# Patient Record
Sex: Male | Born: 1970 | Race: White | Hispanic: No | Marital: Married | Smoking: Current some day smoker
Health system: Southern US, Community
[De-identification: ages and names within clinical notes are randomized; demographics above are authoritative.]

## PROBLEM LIST (undated history)

## (undated) DIAGNOSIS — G8929 Other chronic pain: Secondary | ICD-10-CM

## (undated) DIAGNOSIS — J45909 Unspecified asthma, uncomplicated: Secondary | ICD-10-CM

## (undated) HISTORY — PX: BACK SURGERY: SHX140

---

## 2019-11-11 ENCOUNTER — Emergency Department (HOSPITAL_BASED_OUTPATIENT_CLINIC_OR_DEPARTMENT_OTHER): Payer: 59

## 2019-11-11 ENCOUNTER — Encounter (HOSPITAL_BASED_OUTPATIENT_CLINIC_OR_DEPARTMENT_OTHER): Payer: Self-pay | Admitting: *Deleted

## 2019-11-11 ENCOUNTER — Other Ambulatory Visit: Payer: Self-pay

## 2019-11-11 ENCOUNTER — Inpatient Hospital Stay (HOSPITAL_BASED_OUTPATIENT_CLINIC_OR_DEPARTMENT_OTHER)
Admission: EM | Admit: 2019-11-11 | Discharge: 2019-11-13 | DRG: 581 | Disposition: A | Discharge status: Home / Self Care | Payer: 59 | Attending: Family Medicine | Admitting: Family Medicine

## 2019-11-11 DIAGNOSIS — L03317 Cellulitis of buttock: Secondary | ICD-10-CM

## 2019-11-11 DIAGNOSIS — D461 Refractory anemia with ring sideroblasts: Secondary | ICD-10-CM | POA: Diagnosis present

## 2019-11-11 DIAGNOSIS — A419 Sepsis, unspecified organism: Secondary | ICD-10-CM

## 2019-11-11 DIAGNOSIS — G894 Chronic pain syndrome: Secondary | ICD-10-CM | POA: Diagnosis present

## 2019-11-11 DIAGNOSIS — E876 Hypokalemia: Secondary | ICD-10-CM | POA: Diagnosis present

## 2019-11-11 DIAGNOSIS — L0291 Cutaneous abscess, unspecified: Secondary | ICD-10-CM

## 2019-11-11 DIAGNOSIS — Z20822 Contact with and (suspected) exposure to covid-19: Secondary | ICD-10-CM | POA: Diagnosis present

## 2019-11-11 DIAGNOSIS — Z87891 Personal history of nicotine dependence: Secondary | ICD-10-CM

## 2019-11-11 DIAGNOSIS — L0231 Cutaneous abscess of buttock: Secondary | ICD-10-CM | POA: Diagnosis not present

## 2019-11-11 DIAGNOSIS — Z79899 Other long term (current) drug therapy: Secondary | ICD-10-CM

## 2019-11-11 DIAGNOSIS — W57XXXA Bitten or stung by nonvenomous insect and other nonvenomous arthropods, initial encounter: Secondary | ICD-10-CM | POA: Diagnosis present

## 2019-11-11 DIAGNOSIS — J45909 Unspecified asthma, uncomplicated: Secondary | ICD-10-CM | POA: Diagnosis present

## 2019-11-11 DIAGNOSIS — F419 Anxiety disorder, unspecified: Secondary | ICD-10-CM | POA: Diagnosis present

## 2019-11-11 HISTORY — DX: Other chronic pain: G89.29

## 2019-11-11 HISTORY — DX: Unspecified asthma, uncomplicated: J45.909

## 2019-11-11 LAB — CBC WITH DIFFERENTIAL/PLATELET
Abs Immature Granulocytes: 0.11 10*3/uL — ABNORMAL HIGH (ref 0.00–0.07)
Basophils Absolute: 0 10*3/uL (ref 0.0–0.1)
Basophils Relative: 0 %
Eosinophils Absolute: 0 10*3/uL (ref 0.0–0.5)
Eosinophils Relative: 0 %
HCT: 36.5 % — ABNORMAL LOW (ref 39.0–52.0)
Hemoglobin: 11.7 g/dL — ABNORMAL LOW (ref 13.0–17.0)
Immature Granulocytes: 1 %
Lymphocytes Relative: 7 %
Lymphs Abs: 1.1 10*3/uL (ref 0.7–4.0)
MCH: 26.2 pg (ref 26.0–34.0)
MCHC: 32.1 g/dL (ref 30.0–36.0)
MCV: 81.7 fL (ref 80.0–100.0)
Monocytes Absolute: 0.9 10*3/uL (ref 0.1–1.0)
Monocytes Relative: 6 %
Neutro Abs: 14.1 10*3/uL — ABNORMAL HIGH (ref 1.7–7.7)
Neutrophils Relative %: 86 %
Platelets: 346 10*3/uL (ref 150–400)
RBC: 4.47 MIL/uL (ref 4.22–5.81)
RDW: 15.4 % (ref 11.5–15.5)
WBC: 16.4 10*3/uL — ABNORMAL HIGH (ref 4.0–10.5)
nRBC: 0 % (ref 0.0–0.2)

## 2019-11-11 LAB — PROTIME-INR
INR: 1.1 (ref 0.8–1.2)
Prothrombin Time: 13.8 seconds (ref 11.4–15.2)

## 2019-11-11 LAB — COMPREHENSIVE METABOLIC PANEL
ALT: 13 U/L (ref 0–44)
AST: 15 U/L (ref 15–41)
Albumin: 4.1 g/dL (ref 3.5–5.0)
Alkaline Phosphatase: 77 U/L (ref 38–126)
Anion gap: 15 (ref 5–15)
BUN: 13 mg/dL (ref 6–20)
CO2: 25 mmol/L (ref 22–32)
Calcium: 9.2 mg/dL (ref 8.9–10.3)
Chloride: 99 mmol/L (ref 98–111)
Creatinine, Ser: 1.11 mg/dL (ref 0.61–1.24)
GFR calc Af Amer: >60 mL/min (ref >60)
GFR calc non Af Amer: >60 mL/min (ref >60)
Glucose, Bld: 117 mg/dL — ABNORMAL HIGH (ref 70–99)
Potassium: 3.7 mmol/L (ref 3.5–5.1)
Sodium: 139 mmol/L (ref 135–145)
Total Bilirubin: 0.6 mg/dL (ref 0.3–1.2)
Total Protein: 8.6 g/dL — ABNORMAL HIGH (ref 6.5–8.1)

## 2019-11-11 LAB — URINALYSIS, ROUTINE W REFLEX MICROSCOPIC
Glucose, UA: NEGATIVE mg/dL
Hgb urine dipstick: NEGATIVE
Ketones, ur: 40 mg/dL — AB
Leukocytes,Ua: NEGATIVE
Nitrite: NEGATIVE
Protein, ur: 100 mg/dL — AB
Specific Gravity, Urine: 1.025 (ref 1.005–1.030)
pH: 6 (ref 5.0–8.0)

## 2019-11-11 LAB — URINALYSIS, MICROSCOPIC (REFLEX): RBC / HPF: NONE SEEN RBC/hpf (ref 0–5)

## 2019-11-11 LAB — APTT: aPTT: 50 seconds — ABNORMAL HIGH (ref 24–36)

## 2019-11-11 LAB — SARS CORONAVIRUS 2 BY RT PCR (HOSPITAL ORDER, PERFORMED IN ~~LOC~~ HOSPITAL LAB): SARS Coronavirus 2: NEGATIVE

## 2019-11-11 LAB — LACTIC ACID, PLASMA
Lactic Acid, Venous: 1.3 mmol/L (ref 0.5–1.9)
Lactic Acid, Venous: 0.9 mmol/L (ref 0.5–1.9)

## 2019-11-11 MED ORDER — VANCOMYCIN HCL IN DEXTROSE 1-5 GM/200ML-% IV SOLN
1000.0000 mg | Freq: Once | INTRAVENOUS | Status: DC
  Filled 2019-11-11: qty 200

## 2019-11-11 MED ORDER — VANCOMYCIN HCL IN DEXTROSE 1-5 GM/200ML-% IV SOLN
1000.0000 mg | Freq: Three times a day (TID) | INTRAVENOUS | Status: DC
  Administered 2019-11-11 – 2019-11-13 (×6): 1000 mg via INTRAVENOUS
  Filled 2019-11-11 (×6): qty 200

## 2019-11-11 MED ORDER — ONDANSETRON HCL 4 MG/2ML IJ SOLN
4.0000 mg | Freq: Once | INTRAMUSCULAR | Status: AC
  Administered 2019-11-11: 4 mg via INTRAVENOUS
  Filled 2019-11-11: qty 2

## 2019-11-11 MED ORDER — SODIUM CHLORIDE 0.9 % IV SOLN
2.0000 g | Freq: Once | INTRAVENOUS | Status: AC
  Administered 2019-11-11: 2 g via INTRAVENOUS
  Filled 2019-11-11: qty 20

## 2019-11-11 MED ORDER — MORPHINE SULFATE (PF) 4 MG/ML IV SOLN
4.0000 mg | Freq: Once | INTRAVENOUS | Status: AC
  Administered 2019-11-11: 4 mg via INTRAVENOUS
  Filled 2019-11-11: qty 1

## 2019-11-11 MED ORDER — LIDOCAINE-EPINEPHRINE (PF) 2 %-1:200000 IJ SOLN
10.0000 mL | Freq: Once | INTRAMUSCULAR | Status: AC
  Administered 2019-11-11: 10 mL
  Filled 2019-11-11: qty 10

## 2019-11-11 MED ORDER — VANCOMYCIN HCL 2000 MG/400ML IV SOLN
2000.0000 mg | Freq: Once | INTRAVENOUS | Status: DC
  Filled 2019-11-11: qty 400

## 2019-11-11 MED ORDER — LACTATED RINGERS IV BOLUS
1000.0000 mL | Freq: Once | INTRAVENOUS | Status: AC
  Administered 2019-11-11: 1000 mL via INTRAVENOUS

## 2019-11-11 NOTE — ED Triage Notes (Signed)
C/o insect bite to right buttocks x 1 week ago

## 2019-11-11 NOTE — ED Provider Notes (Signed)
[facility] EMERGENCY DEPARTMENT Provider Note   CSN: [id number] Arrival date & time: 11/11/19  1739     History Chief Complaint  Patient presents with  . Insect Bite    Sergio Conley is a 49 y.o. male with past medical history of asthma, chronic back pain, who presents today for evaluation of pain on his right buttock.  He reports that he was recently traveling and about 8 days ago noticed a area of swelling on the right sided buttock.  He denies any injections.  He thinks he may have been bit by a spider however did not see a spider or bug in the area.  He states that he had been putting Neosporin on it and it had been getting significantly better until he was driving for an extended period of time and when he stopped at a hotel realized that he had blood all over his shorts.  He states that over the past 36 hours he has had significant increase in swelling and pain.  He reports that he has had chills however does not think he has had any fevers.  He did have Tylenol earlier today with his chronic pain medications.  He is still traveling and lives in [place].  He denies any allergies to medications.  He has not had any antibiotics recently.  HPI     Past Medical History:  Diagnosis Date  . Asthma   . Chronic back pain     Patient Active Problem List   Diagnosis Date Noted  . Cellulitis and abscess of buttock 11/11/2019    Past Surgical History:  Procedure Laterality Date  . BACK SURGERY         No family history on file.  Social History   Tobacco Use  . Smoking status: Current Some Day Smoker  . Smokeless tobacco: Never Used  Vaping Use  . Vaping Use: Never used  Substance Use Topics  . Alcohol use: Not Currently  . Drug use: Not Currently    Home Medications Prior to Admission medications   Medication Sig Start Date End Date Taking? Authorizing Provider  oxyCODONE-acetaminophen (PERCOCET/ROXICET) 5-325 MG tablet Take by mouth every 4 (four)  hours as needed for severe pain.   Yes [provider]    Allergies    Patient has no known allergies.  Review of Systems   Review of Systems  Constitutional: Positive for chills and fatigue. Negative for fever.  HENT: Negative for congestion.   Respiratory: Negative for cough and shortness of breath.   Cardiovascular: Negative for chest pain.  Gastrointestinal: Negative for abdominal pain, nausea and vomiting.  Genitourinary: Negative for dysuria and urgency.  Musculoskeletal: Positive for back pain (Chronic, unchanged).  Skin: Positive for color change and wound.  Neurological: Negative for weakness and headaches.  All other systems reviewed and are negative.   Physical Exam Updated Vital Signs BP (!) 158/86   Pulse 86   Temp 98.9 F (37.2 C)   Resp 16   Ht 5\' 7"  (1.702 m)   Wt 98.9 kg   SpO2 98%   BMI 34.14 kg/m   Physical Exam Vitals and nursing note reviewed.  Constitutional:      General: He is not in acute distress.    Appearance: He is well-developed. He is not diaphoretic.  HENT:     Head: Normocephalic and atraumatic.  Eyes:     General: No scleral icterus.       Right eye: No discharge.  Left eye: No discharge.     Conjunctiva/sclera: Conjunctivae normal.  Cardiovascular:     Rate and Rhythm: Normal rate and regular rhythm.  Pulmonary:     Effort: Pulmonary effort is normal. No respiratory distress.     Breath sounds: No stridor.  Abdominal:     General: There is no distension.     Tenderness: There is no abdominal tenderness. There is no guarding.  Musculoskeletal:        General: No deformity.     Cervical back: Normal range of motion and neck supple.     Right lower leg: No edema.     Left lower leg: No edema.  Skin:    General: Skin is warm and dry.     Comments: There is an area approximately 25 cm x 15 cm on the right buttock with a centralized area of significant edema and fluctuance with drainage.  Please see clinical  images.  Neurological:     General: No focal deficit present.     Mental Status: He is alert and oriented to person, place, and time.     Motor: No abnormal muscle tone.  Psychiatric:        Behavior: Behavior normal.         ED Results / Procedures / Treatments   Labs (all labs ordered are listed, but only abnormal results are displayed) Labs Reviewed  COMPREHENSIVE METABOLIC PANEL - Abnormal; Notable for the following components:      Result Value   Glucose, Bld 117 (*)    Total Protein 8.6 (*)    All other components within normal limits  CBC WITH DIFFERENTIAL/PLATELET - Abnormal; Notable for the following components:   WBC 16.4 (*)    Hemoglobin 11.7 (*)    HCT 36.5 (*)    Neutro Abs 14.1 (*)    Abs Immature Granulocytes 0.11 (*)    All other components within normal limits  APTT - Abnormal; Notable for the following components:   aPTT 50 (*)    All other components within normal limits  URINALYSIS, ROUTINE W REFLEX MICROSCOPIC - Abnormal; Notable for the following components:   APPearance HAZY (*)    Bilirubin Urine SMALL (*)    Ketones, ur 40 (*)    Protein, ur 100 (*)    All other components within normal limits  URINALYSIS, MICROSCOPIC (REFLEX) - Abnormal; Notable for the following components:   Bacteria, UA RARE (*)    All other components within normal limits  SARS CORONAVIRUS 2 BY RT PCR (HOSPITAL ORDER, [facility])  CULTURE, BLOOD (ROUTINE X 2)  CULTURE, BLOOD (ROUTINE X 2)  URINE CULTURE  LACTIC ACID, PLASMA  LACTIC ACID, PLASMA  PROTIME-INR  HIV ANTIBODY (ROUTINE TESTING W REFLEX)    EKG EKG Interpretation  Date/Time:  Wednesday November 11 2019 18:57:04 EDT Ventricular Rate:  103 PR Interval:    QRS Duration: 96 QT Interval:  324 QTC Calculation: 425 R Axis:   62 Text Interpretation: Sinus tachycardia Probable left atrial enlargement Minimal ST depression, inferior leads No prior for comparison No STEMI Confirmed by  Nanda Quinton [phone]) on 11/11/2019 7:25:39 PM   Radiology DG Chest Port 1 View  Result Date: 11/11/2019 CLINICAL DATA:  Sepsis. Insect bite to the RIGHT buttock 1 week ago. EXAM: PORTABLE CHEST 1 VIEW COMPARISON:  None. FINDINGS: The heart size and mediastinal contours are within normal limits. Both lungs are clear. The visualized skeletal structures are unremarkable. IMPRESSION: No  active disease. Electronically Signed   By: Nolon Nations M.D.   On: 11/11/2019 19:50    Procedures .Marland KitchenIncision and Drainage  Date/Time: 11/11/2019 11:47 PM Performed by: Lorin Glass, PA-C Authorized by: Lorin Glass, PA-C   Consent:    Consent obtained:  Verbal   Consent given by:  Patient   Risks discussed:  Bleeding, incomplete drainage, pain and infection (Damage to other structures, need for additional procedures)   Alternatives discussed:  No treatment, alternative treatment and referral Location:    Type:  Abscess   Size:  6cmx6cm   Location:  Lower extremity   Lower extremity location:  Buttock   Buttock location:  R buttock Pre-procedure details:    Skin preparation:  Chloraprep Anesthesia (see MAR for exact dosages):    Anesthesia method:  Local infiltration   Local anesthetic:  Lidocaine 2% WITH epi Procedure type:    Complexity:  Complex Procedure details:    Incision types:  Stab incision   Incision depth:  Subcutaneous   Scalpel blade:  11   Wound management:  Probed and deloculated and irrigated with saline   Drainage:  Purulent and bloody (There is black necrotic material drainage also. )   Drainage amount:  Moderate   Wound treatment:  Wound left open   Packing material: 1 inch iodoform gauze.  Post-procedure details:    Patient tolerance of procedure:  Tolerated well, no immediate complications .Critical Care Performed by: Lorin Glass, PA-C Authorized by: Lorin Glass, PA-C   Critical care provider statement:    Critical care time  (minutes):  45   Critical care time was exclusive of:  Separately billable procedures and treating other patients and teaching time   Critical care was necessary to treat or prevent imminent or life-threatening deterioration of the following conditions:  Sepsis   Critical care was time spent personally by me on the following activities:  Discussions with consultants, evaluation of patient's response to treatment, examination of patient, ordering and performing treatments and interventions, ordering and review of laboratory studies, ordering and review of radiographic studies, pulse oximetry, re-evaluation of patient's condition, obtaining history from patient or surrogate, review of old charts and development of treatment plan with patient or surrogate Ultrasound ED Soft Tissue  Date/Time: 11/11/2019 11:55 PM Performed by: Lorin Glass, PA-C Authorized by: Lorin Glass, PA-C   Procedure details:    Indications: localization of abscess and evaluate for cellulitis     Transverse view:  Visualized   Longitudinal view:  Visualized   Images: archived   Location:    Location: buttocks     Side:  Right Findings:     abscess present    cellulitis present   (including critical care time)  Medications Ordered in ED Medications  vancomycin (VANCOREADY) IVPB 2000 mg/400 mL (has no administration in time range)  vancomycin (VANCOCIN) IVPB 1000 mg/200 mL premix (0 mg Intravenous Stopped 11/11/19 2136)  cefTRIAXone (ROCEPHIN) 2 g in sodium chloride 0.9 % 100 mL IVPB (0 g Intravenous Stopped 11/11/19 2037)  ondansetron (ZOFRAN) injection 4 mg (4 mg Intravenous Given 11/11/19 1943)  lactated ringers bolus 1,000 mL ( Intravenous Stopped 11/11/19 2131)  morphine 4 MG/ML injection 4 mg (4 mg Intravenous Given 11/11/19 1943)  lidocaine-EPINEPHrine (XYLOCAINE W/EPI) 2 %-1:200000 (PF) injection 10 mL (10 mLs Infiltration Given 11/11/19 2002)  morphine 4 MG/ML injection 4 mg (4 mg Intravenous Given  11/11/19 2153)    ED Course  I have reviewed the  triage vital signs and the nursing notes.  Pertinent labs & imaging results that were available during my care of the patient were reviewed by me and considered in my medical decision making (see chart for details).    MDM Rules/Calculators/A&P                         Patient is a 49 year old man who presents today for evaluation of rapidly progressing pain, redness and swelling on his right sided buttock.  He states that over the past 36 hours he has had an area go from the size of a small pimple up to his entire right buttock.  On my exam he has an area that measures about 25 cm x 15 cm with a centralized area of abscess.  Ultrasound was used to confirm presence of abscess.  I&D was with drainage of purulent, bloody, and necrotic material.  Given the patient was tachycardic with leukocytosis in the setting of a obvious infection he meets sepsis criteria.    Given that his lactic is not over 4 and he is not hypotensive he is not given a full 30/kg fluid bolus, however he is given IV LR bolus.  His pain is treated with morphine while in the emergency room.    Given the rapid progression of the infection, the size of the infection, his tachycardia and leukocytosis I do not think that PO antibiotics are appropriate.    I spoke with hospitalist Dr. Tonie Griffith who will admit patient.   He was treated with broad-spectrum antibiotics including Vanco and Rocephin.  Covid test was sent and is negative.  Note: Portions of this report may have been transcribed using voice recognition software. Every effort was made to ensure accuracy; however, inadvertent computerized transcription errors may be present  Final Clinical Impression(s) / ED Diagnoses Final diagnoses:  Cellulitis of buttock  Sepsis without acute organ dysfunction, due to unspecified organism [facility])  Abscess    Rx / DC Orders ED Discharge Orders    None       Ollen Gross 11/11/19 2358    Margette Fast, MD 11/12/19 1221

## 2019-11-11 NOTE — Progress Notes (Signed)
Pharmacy Antibiotic Note  Sergio Conley is a 49 y.o. male admitted on 11/11/2019 with cellulitis/sepsis.  Pharmacy has been consulted for vancomycin dosing. Ceftriaxone per MD.  Tachy, leukocytosis, LA 1.3  Plan: Vancomycin 2000 mg IV x 1, then 1000 mg IV every 8 hours (target vancomycin trough 15-20) Monitor renal function, Cx and clinical progression to narrow Vancomycin trough at steady state  Height: 5\' 7"  (170.2 cm) Weight: 98.9 kg (218 lb) IBW/kg (Calculated) : 66.1  Temp (24hrs), Avg:98.2 F (36.8 C), Min:98.2 F (36.8 C), Max:98.2 F (36.8 C)  Recent Labs  Lab 11/11/19 1903  WBC 16.4*  CREATININE 1.11  LATICACIDVEN 1.3    Estimated Creatinine Clearance: 92.2 mL/min (by C-G formula based on SCr of 1.11 mg/dL).    No Known Allergies  Bertis Ruddy, PharmD Clinical Pharmacist ED Pharmacist Phone # [phone] 11/11/2019 7:38 PM

## 2019-11-12 ENCOUNTER — Encounter (HOSPITAL_COMMUNITY): Payer: Self-pay | Admitting: Family Medicine

## 2019-11-12 DIAGNOSIS — A419 Sepsis, unspecified organism: Secondary | ICD-10-CM | POA: Diagnosis present

## 2019-11-12 DIAGNOSIS — L0231 Cutaneous abscess of buttock: Secondary | ICD-10-CM

## 2019-11-12 DIAGNOSIS — L03317 Cellulitis of buttock: Principal | ICD-10-CM

## 2019-11-12 DIAGNOSIS — F419 Anxiety disorder, unspecified: Secondary | ICD-10-CM | POA: Diagnosis present

## 2019-11-12 DIAGNOSIS — Z79899 Other long term (current) drug therapy: Secondary | ICD-10-CM | POA: Diagnosis not present

## 2019-11-12 DIAGNOSIS — D461 Refractory anemia with ring sideroblasts: Secondary | ICD-10-CM | POA: Diagnosis present

## 2019-11-12 DIAGNOSIS — G894 Chronic pain syndrome: Secondary | ICD-10-CM | POA: Diagnosis present

## 2019-11-12 DIAGNOSIS — E876 Hypokalemia: Secondary | ICD-10-CM | POA: Diagnosis present

## 2019-11-12 DIAGNOSIS — J45909 Unspecified asthma, uncomplicated: Secondary | ICD-10-CM | POA: Diagnosis present

## 2019-11-12 DIAGNOSIS — Z20822 Contact with and (suspected) exposure to covid-19: Secondary | ICD-10-CM | POA: Diagnosis present

## 2019-11-12 DIAGNOSIS — Z87891 Personal history of nicotine dependence: Secondary | ICD-10-CM | POA: Diagnosis not present

## 2019-11-12 DIAGNOSIS — W57XXXA Bitten or stung by nonvenomous insect and other nonvenomous arthropods, initial encounter: Secondary | ICD-10-CM | POA: Diagnosis present

## 2019-11-12 LAB — URINE CULTURE: Culture: NO GROWTH

## 2019-11-12 LAB — COMPREHENSIVE METABOLIC PANEL
ALT: 12 U/L (ref 0–44)
AST: 14 U/L — ABNORMAL LOW (ref 15–41)
Albumin: 3.4 g/dL — ABNORMAL LOW (ref 3.5–5.0)
Alkaline Phosphatase: 61 U/L (ref 38–126)
Anion gap: 10 (ref 5–15)
BUN: 10 mg/dL (ref 6–20)
CO2: 27 mmol/L (ref 22–32)
Calcium: 9 mg/dL (ref 8.9–10.3)
Chloride: 105 mmol/L (ref 98–111)
Creatinine, Ser: 0.89 mg/dL (ref 0.61–1.24)
GFR calc Af Amer: >60 mL/min (ref >60)
GFR calc non Af Amer: >60 mL/min (ref >60)
Glucose, Bld: 113 mg/dL — ABNORMAL HIGH (ref 70–99)
Potassium: 3.3 mmol/L — ABNORMAL LOW (ref 3.5–5.1)
Sodium: 142 mmol/L (ref 135–145)
Total Bilirubin: 0.7 mg/dL (ref 0.3–1.2)
Total Protein: 6.9 g/dL (ref 6.5–8.1)

## 2019-11-12 LAB — CBC WITH DIFFERENTIAL/PLATELET
Abs Immature Granulocytes: 0.05 10*3/uL (ref 0.00–0.07)
Basophils Absolute: 0 10*3/uL (ref 0.0–0.1)
Basophils Relative: 0 %
Eosinophils Absolute: 0.1 10*3/uL (ref 0.0–0.5)
Eosinophils Relative: 1 %
HCT: 30.9 % — ABNORMAL LOW (ref 39.0–52.0)
Hemoglobin: 9.7 g/dL — ABNORMAL LOW (ref 13.0–17.0)
Immature Granulocytes: 0 %
Lymphocytes Relative: 13 %
Lymphs Abs: 1.5 10*3/uL (ref 0.7–4.0)
MCH: 25.9 pg — ABNORMAL LOW (ref 26.0–34.0)
MCHC: 31.4 g/dL (ref 30.0–36.0)
MCV: 82.6 fL (ref 80.0–100.0)
Monocytes Absolute: 0.7 10*3/uL (ref 0.1–1.0)
Monocytes Relative: 6 %
Neutro Abs: 9.1 10*3/uL — ABNORMAL HIGH (ref 1.7–7.7)
Neutrophils Relative %: 80 %
Platelets: 259 10*3/uL (ref 150–400)
RBC: 3.74 MIL/uL — ABNORMAL LOW (ref 4.22–5.81)
RDW: 15.4 % (ref 11.5–15.5)
WBC: 11.5 10*3/uL — ABNORMAL HIGH (ref 4.0–10.5)
nRBC: 0 % (ref 0.0–0.2)

## 2019-11-12 LAB — GLUCOSE, CAPILLARY
Glucose-Capillary: 101 mg/dL — ABNORMAL HIGH (ref 70–99)
Glucose-Capillary: 91 mg/dL (ref 70–99)

## 2019-11-12 LAB — HIV ANTIBODY (ROUTINE TESTING W REFLEX): HIV Screen 4th Generation wRfx: NONREACTIVE

## 2019-11-12 LAB — HEMOGLOBIN A1C
Hgb A1c MFr Bld: 5.3 % (ref 4.8–5.6)
Mean Plasma Glucose: 105.41 mg/dL

## 2019-11-12 MED ORDER — ENOXAPARIN SODIUM 40 MG/0.4ML ~~LOC~~ SOLN: 40.0000 mg | SUBCUTANEOUS | Status: DC

## 2019-11-12 MED ORDER — OXYCODONE-ACETAMINOPHEN 5-325 MG PO TABS: 1.0000 | ORAL_TABLET | Freq: Four times a day (QID) | ORAL | Status: DC | PRN

## 2019-11-12 MED ORDER — KETOROLAC TROMETHAMINE 30 MG/ML IJ SOLN: 30.0000 mg | Freq: Four times a day (QID) | INTRAMUSCULAR | Status: DC | PRN

## 2019-11-12 MED ORDER — ONDANSETRON HCL 4 MG PO TABS: 4.0000 mg | ORAL_TABLET | Freq: Four times a day (QID) | ORAL | Status: DC | PRN

## 2019-11-12 MED ORDER — HYDROMORPHONE HCL 1 MG/ML IJ SOLN: 1.0000 mg | INTRAMUSCULAR | Status: DC | PRN

## 2019-11-12 MED ORDER — OXYCODONE HCL 5 MG PO TABS: 5.0000 mg | ORAL_TABLET | Freq: Four times a day (QID) | ORAL | Status: DC | PRN

## 2019-11-12 MED ORDER — ACETAMINOPHEN 325 MG PO TABS: 650.0000 mg | ORAL_TABLET | Freq: Four times a day (QID) | ORAL | Status: DC | PRN

## 2019-11-12 MED ORDER — HYDRALAZINE HCL 20 MG/ML IJ SOLN: 10.0000 mg | INTRAMUSCULAR | Status: DC | PRN

## 2019-11-12 MED ORDER — INSULIN ASPART 100 UNIT/ML ~~LOC~~ SOLN: 0.0000 [IU] | Freq: Three times a day (TID) | SUBCUTANEOUS | Status: DC

## 2019-11-12 MED ORDER — MAGNESIUM CITRATE PO SOLN: 1.0000 | Freq: Once | ORAL | Status: DC | PRN

## 2019-11-12 MED ORDER — ONDANSETRON HCL 4 MG/2ML IJ SOLN: 4.0000 mg | Freq: Four times a day (QID) | INTRAMUSCULAR | Status: DC | PRN

## 2019-11-12 MED ORDER — LACTATED RINGERS IV BOLUS (SEPSIS)
1000.0000 mL | Freq: Once | INTRAVENOUS | Status: AC
  Administered 2019-11-12: 1000 mL via INTRAVENOUS

## 2019-11-12 MED ORDER — IBUPROFEN 200 MG PO TABS: 400.0000 mg | ORAL_TABLET | Freq: Four times a day (QID) | ORAL | Status: DC | PRN

## 2019-11-12 MED ORDER — POTASSIUM CHLORIDE CRYS ER 20 MEQ PO TBCR
40.0000 meq | EXTENDED_RELEASE_TABLET | Freq: Once | ORAL | Status: AC
  Administered 2019-11-12: 40 meq via ORAL
  Filled 2019-11-12: qty 2

## 2019-11-12 MED ORDER — PROCHLORPERAZINE EDISYLATE 10 MG/2ML IJ SOLN: 5.0000 mg | INTRAMUSCULAR | Status: DC | PRN

## 2019-11-12 MED ORDER — ALBUTEROL SULFATE (2.5 MG/3ML) 0.083% IN NEBU: 2.5000 mg | INHALATION_SOLUTION | RESPIRATORY_TRACT | Status: DC | PRN

## 2019-11-12 MED ORDER — ACETAMINOPHEN 650 MG RE SUPP: 650.0000 mg | Freq: Four times a day (QID) | RECTAL | Status: DC | PRN

## 2019-11-12 MED ORDER — IPRATROPIUM BROMIDE 0.02 % IN SOLN
0.5000 mg | Freq: Four times a day (QID) | RESPIRATORY_TRACT | Status: DC
  Filled 2019-11-12: qty 2.5

## 2019-11-12 MED ORDER — OXYCODONE-ACETAMINOPHEN 10-325 MG PO TABS: 1.0000 | ORAL_TABLET | Freq: Four times a day (QID) | ORAL | Status: DC | PRN

## 2019-11-12 MED ORDER — HYDROCODONE-ACETAMINOPHEN 5-325 MG PO TABS: 1.0000 | ORAL_TABLET | ORAL | Status: DC | PRN

## 2019-11-12 NOTE — Progress Notes (Signed)
Pt received in room 1612. Arrived on stretcher pushed by ambulance staff. Arrived in stable condition. Assessment completed and pt made comfortable. Admitting MD notified per protocol. Call received from MD reporting that he will be placing some orders.

## 2019-11-12 NOTE — H&P (Signed)
History and Physical   Patient: Sergio Conley                            PCP: Patient, No Pcp Per                    DOB: Sep 09, 1970            DOA: 11/11/2019 IRS:[id number]             DOS: 11/12/2019, 7:40 AM  Patient coming from:   Home  I have personally reviewed patient's medical records, in electronic medical records, including:  [facility], and care everywhere.    Chief Complaint:   Chief Complaint  Patient presents with  . Insect Bite    History of present illness:    Sergio Conley is a 49 y.o. male with past medical history of anxiety, chronic pain, who lives in [place], was visiting family at [place] when horseback riding apparently had a bug bite in her right buttocks, was driving back home within 12 hours noted blister erythema edema tenderness in the right buttocks.  Was experiencing some chills.  After 36 hours noted significant bloody discharge, erythema edema worsen.   Patient Denies having: Fever, Chills, Cough, SOB, Chest Pain, Abd pain, N/V/D, headache, dizziness, lightheadedness,  Dysuria, Joint pain, rash, open wounds  ED Course:   Upon arrival met SIRS criteria temp nine 9.1, pulse as high as 120, currently 78, respiratory rate as high as 26, currently 15, blood pressure 146/91, satting 99% room air Leukocytosis 16.4, lactic acid 1.3 Patient was evaluated in ED, status post right buttocks I&D on 11/12/2019.   Blood and urine cultures were obtained Patient started on broad-spectrum antibiotics of vancomycin and Rocephin.   Review of Systems: As per HPI, otherwise 10 point review of systems were negative.   ----------------------------------------------------------------------------------------------------------------------  No Known Allergies  Home MEDs:  Prior to Admission medications   Medication Sig Start Date End Date Taking? Authorizing Provider  oxyCODONE-acetaminophen (PERCOCET) 10-325 MG tablet Take 1 tablet by mouth every 4 (four)  hours as needed for pain.   Yes [provider]    PRN MEDs: acetaminophen **OR** acetaminophen, albuterol, hydrALAZINE, HYDROmorphone (DILAUDID) injection, ibuprofen, ketorolac, magnesium citrate, ondansetron **OR** ondansetron (ZOFRAN) IV, oxyCODONE-acetaminophen, prochlorperazine  Past Medical History:  Diagnosis Date  . Asthma   . Chronic back pain     Past Surgical History:  Procedure Laterality Date  . BACK SURGERY       reports that he has been smoking. He has never used smokeless tobacco. He reports previous alcohol use. He reports previous drug use.   History reviewed. No pertinent family history.  Physical Exam:   Vitals:   11/12/19 0000 11/12/19 0029 11/12/19 0115 11/12/19 0443  BP: 137/83  (!) 144/82 126/82  Pulse: 87  89 75  Resp: (!) 25     Temp:  98.4 F (36.9 C) 99.1 F (37.3 C) 98.5 F (36.9 C)  TempSrc:  Oral Oral Oral  SpO2: 98%  96% 96%  Weight:      Height:       Constitutional: NAD, calm, comfortable Eyes: PERRL, lids and conjunctivae normal ENMT: Mucous membranes are moist. Posterior pharynx clear of any exudate or lesions.Normal dentition.  Neck: normal, supple, no masses, no thyromegaly Respiratory: clear to auscultation bilaterally, no wheezing, no crackles. Normal respiratory effort. No accessory muscle use.  Cardiovascular: Regular rate and rhythm, no  murmurs / rubs / gallops. No extremity edema. 2+ pedal pulses. No carotid bruits.  Abdomen: no tenderness, no masses palpated. No hepatosplenomegaly. Bowel sounds positive.  Musculoskeletal: no clubbing / cyanosis. No joint deformity upper and lower extremities. Good ROM, no contractures. Normal muscle tone.  Neurologic: CN II-XII grossly intact. Sensation intact, DTR normal. Strength 5/5 in all 4.  Psychiatric: Normal judgment and insight. Alert and oriented x 3. Normal mood.  Skin: no rashes, lesions, ulcers. No induration  Wounds:                Labs on  admission:    I have personally reviewed following labs and imaging studies  CBC: Recent Labs  Lab 11/11/19 1903 11/12/19 0533  WBC 16.4* 11.5*  NEUTROABS 14.1* 9.1*  HGB 11.7* 9.7*  HCT 36.5* 30.9*  MCV 81.7 82.6  PLT 346 383   Basic Metabolic Panel: Recent Labs  Lab 11/11/19 1903 11/12/19 0533  NA 139 142  K 3.7 3.3*  CL 99 105  CO2 25 27  GLUCOSE 117* 113*  BUN 13 10  CREATININE 1.11 0.89  CALCIUM 9.2 9.0   GFR: Estimated Creatinine Clearance: 114.9 mL/min (by C-G formula based on SCr of 0.89 mg/dL). Liver Function Tests: Recent Labs  Lab 11/11/19 1903 11/12/19 0533  AST 15 14*  ALT 13 12  ALKPHOS 77 61  BILITOT 0.6 0.7  PROT 8.6* 6.9  ALBUMIN 4.1 3.4*   No results for input(s): LIPASE, AMYLASE in the last 168 hours. No results for input(s): AMMONIA in the last 168 hours. Coagulation Profile: Recent Labs  Lab 11/11/19 1903  INR 1.1   Cardiac Enzymes: No results for input(s): CKTOTAL, CKMB, CKMBINDEX, TROPONINI in the last 168 hours. BNP (last 3 results) No results for input(s): PROBNP in the last 8760 hours. HbA1C: Recent Labs    11/12/19 0533  HGBA1C 5.3   CBG: Recent Labs  Lab 11/12/19 0731  GLUCAP 101*   LUrine analysis:    Component Value Date/Time   COLORURINE YELLOW 11/11/2019 2002   APPEARANCEUR HAZY (A) 11/11/2019 2002   LABSPEC 1.025 11/11/2019 2002   PHURINE 6.0 11/11/2019 2002   GLUCOSEU NEGATIVE 11/11/2019 2002   HGBUR NEGATIVE 11/11/2019 2002   BILIRUBINUR SMALL (A) 11/11/2019 2002   KETONESUR 40 (A) 11/11/2019 2002   PROTEINUR 100 (A) 11/11/2019 2002   NITRITE NEGATIVE 11/11/2019 2002   LEUKOCYTESUR NEGATIVE 11/11/2019 2002     Radiologic Exams on Admission:   DG Chest Port 1 View  Result Date: 11/11/2019 CLINICAL DATA:  Sepsis. Insect bite to the RIGHT buttock 1 week ago. EXAM: PORTABLE CHEST 1 VIEW COMPARISON:  None. FINDINGS: The heart size and mediastinal contours are within normal limits. Both lungs are  clear. The visualized skeletal structures are unremarkable. IMPRESSION: No active disease. Electronically Signed   By: Nolon Nations M.D.   On: 11/11/2019 19:50    EKG:   Independently reviewed.  Orders placed or performed during the hospital encounter of 11/11/19  . ED EKG 12-Lead  . ED EKG 12-Lead  . EKG 12-Lead  . EKG 12-Lead   ---------------------------------------------------------------------------------------------------------------------------------------    Assessment / Plan:   Active Problems:   Cellulitis and abscess of buttock   Sepsis ([place])  SIRS -Due to cellulitis/abscess of right buttocks- ?post insect bite -Ruling out sepsis -On admission, upon arrival in ED evaluation patient met the SIRS criteria/ruling out sepsis T-max 99.1, briefly tachycardic of HR 120 currently 75, tachypneic with RR 25 blood pressure 126/82, satting  96% on room air -Per sepsis protocol status post fluid resuscitation with lactated Ringer's, -Currently hemodynamically stable -Blood and urine cultures have been obtained -Patient started on broad-spectrum antibiotics of vancomycin and Rocephin -Patient is improving   Cellulitis/abscess right buttocks -insect bite -continue current antibiotics of vancomycin and Zosyn -We will narrow down antibiotics -Continue to improve, afebrile, normotensive, improved leukocytosis -Status post I&D in ED -We will continue monitor closely -If culture remains negative in 24 hours, will switch to p.o. antibiotics ID curbside recommended clindamycin versus doxycycline/cephalexin for 7-10 days  Hypokalemia Potassium 3.3 this a.m., repleting orally  Chronic pain syndrome Resuming home medication of Percocet, as needed hydromorphone  History of asthma Currently on room air satting 96% -As needed DuoNeb treatments supplemental oxygen as needed  History of anxiety -As needed anxiolytics  -Insect bite  Cultures:  -11/13/2019 blood cultures  x2>> -Urine culture ?  Wound culture Antimicrobial: -11/12/2019 >> IV vancomycin -11/12/2019 IV Rocephin x1. -11/12/2019 IV Zosyn  Consults called:  Wound care,  -------------------------------------------------------------------------------------------------------------------------------------------- DVT prophylaxis: SCD/Compression stockings and Lovenox SQ Code Status:   Code Status: Full Code   Admission status: Patient will be admitted as Inpatient, with a greater than 2 midnight length of stay.   Family Communication:  none at bedside  (The above findings and plan of care has been discussed with patient in detail, the patient expressed understanding and agreement of above plan)  --------------------------------------------------------------------------------------------------------------------------------------------------  Disposition Plan: >3 days Status is: Inpatient  Remains inpatient appropriate because:Inpatient level of care appropriate due to severity of illness   Dispo: The patient is from: Home              Anticipated d/c is to: Home              Anticipated d/c date is: 3 days              Patient currently is not medically stable to d/c.      -------------------------------------------------------------------------------------------------------------------------------------------  Time spent: > than  53  Min.   SIGNED: Deatra Hodges, MD, FACP, FHM. [facility],  Pager (Please use [email or web address] to page to text)  If 7PM-7AM, please contact night-coverage [email or web address] 11/12/2019, 7:40 AM

## 2019-11-12 NOTE — Consult Note (Signed)
[place] Nurse Consult Note: Reason for Consult: right buttock cellulitis Wound type: s/p I&D right buttock abscess  Pressure Injury POA: NA Measurement:25cm x 15cm area of fluctuance and active drainage documented by ED MD. Stab incision left open and packed  Wound bed: NA Drainage (amount, consistency, odor) bloody drainage noted in procedure note as well as black necrotic matter.  Periwound: edema, erythema  Dressing procedure/placement/frequency: Irrigate wound with saline in syringe daily, dry Pack with 1/2" iodoform packing strip daily, cover with dry dressing. Secure with tape and/or mesh  Underwear.    Re consult if needed, will not follow at this time. Thanks  Maanvi Lecompte [employer], RN,CWOCN, CNS, [place] [phone])

## 2019-11-13 DIAGNOSIS — D461 Refractory anemia with ring sideroblasts: Secondary | ICD-10-CM

## 2019-11-13 LAB — CBC
HCT: 34.5 % — ABNORMAL LOW (ref 39.0–52.0)
Hemoglobin: 10.4 g/dL — ABNORMAL LOW (ref 13.0–17.0)
MCH: 25.7 pg — ABNORMAL LOW (ref 26.0–34.0)
MCHC: 30.1 g/dL (ref 30.0–36.0)
MCV: 85.4 fL (ref 80.0–100.0)
Platelets: 281 10*3/uL (ref 150–400)
RBC: 4.04 MIL/uL — ABNORMAL LOW (ref 4.22–5.81)
RDW: 15.3 % (ref 11.5–15.5)
WBC: 10.6 10*3/uL — ABNORMAL HIGH (ref 4.0–10.5)
nRBC: 0 % (ref 0.0–0.2)

## 2019-11-13 LAB — BASIC METABOLIC PANEL
Anion gap: 12 (ref 5–15)
BUN: 12 mg/dL (ref 6–20)
CO2: 28 mmol/L (ref 22–32)
Calcium: 9 mg/dL (ref 8.9–10.3)
Chloride: 103 mmol/L (ref 98–111)
Creatinine, Ser: 0.89 mg/dL (ref 0.61–1.24)
GFR calc Af Amer: >60 mL/min (ref >60)
GFR calc non Af Amer: >60 mL/min (ref >60)
Glucose, Bld: 101 mg/dL — ABNORMAL HIGH (ref 70–99)
Potassium: 4.2 mmol/L (ref 3.5–5.1)
Sodium: 143 mmol/L (ref 135–145)

## 2019-11-13 MED ORDER — CEPHALEXIN 500 MG PO CAPS: 500.0000 mg | ORAL_CAPSULE | Freq: Three times a day (TID) | ORAL | 0 refills | Status: AC

## 2019-11-13 MED ORDER — SODIUM CHLORIDE 0.9 % IV SOLN
1.0000 g | INTRAVENOUS | Status: DC
  Administered 2019-11-13: 1 g via INTRAVENOUS
  Filled 2019-11-13: qty 1

## 2019-11-13 MED ORDER — ACETAMINOPHEN 325 MG PO TABS: 650.0000 mg | ORAL_TABLET | Freq: Four times a day (QID) | ORAL | 0 refills | Status: AC | PRN

## 2019-11-13 MED ORDER — IBUPROFEN 400 MG PO TABS: 400.0000 mg | ORAL_TABLET | Freq: Four times a day (QID) | ORAL | 0 refills | Status: AC | PRN

## 2019-11-13 MED ORDER — LACTINEX PO CHEW: 1.0000 | CHEWABLE_TABLET | Freq: Three times a day (TID) | ORAL | 0 refills | Status: AC

## 2019-11-13 MED ORDER — DOXYCYCLINE HYCLATE 50 MG PO CAPS
100.0000 mg | ORAL_CAPSULE | Freq: Two times a day (BID) | ORAL | 0 refills | Status: AC
Start: 2019-11-13 — End: 2019-11-20

## 2019-11-13 NOTE — Discharge Summary (Signed)
Physician Discharge Summary [facility]    Patient: Sergio Conley                   Admit date: 11/11/2019   DOB: May 08, 1970             Discharge date:11/13/2019/10:54 AM SWF:[id number]                          PCP: Patient, No Pcp Per  Disposition: HOME   Recommendations for Outpatient Follow-up:   . Follow up: in 1 week  Discharge Condition: Stable   Code Status:   Code Status: Full Code  Diet recommendation: Regular healthy diet   Discharge Diagnoses:    Principal Problem:   IRSA (idiopathic refractory sideroblastic anemia) (HCC) Active Problems:   Cellulitis and abscess of buttock   Sepsis ([place])   Hypokalemia   History of Present Illness/ Hospital Course Sergio Conley Summary:   Sergio Tashiro Rodriguezis a 49 y.o. male with past medical history of anxiety, chronic pain, who lives in [place], was visiting family at [place] when horseback riding apparently had a bug bite in her right buttocks, was driving back home within 12 hours noted blister erythema edema tenderness in the right buttocks.  Was experiencing some chills.  After 36 hours noted significant bloody discharge, erythema edema worsen.   Patient Denies having: Fever, Chills, Cough, SOB, Chest Pain, Abd pain, N/V/D, headache, dizziness, lightheadedness,  Dysuria, Joint pain, rash, open wounds  ED Course:   Upon arrival met SIRS criteria temp nine 9.1, pulse as high as 120, currently 78, respiratory rate as high as 26, currently 15, blood pressure 146/91, satting 99% room air Leukocytosis 16.4, lactic acid 1.3 Patient was evaluated in ED, status post right buttocks I&D on 11/12/2019.   Blood and urine cultures were obtained Patient started on broad-spectrum antibiotics of vancomycin and Rocephin.  SIRS -Due to cellulitis/abscess of right buttocks- ?post insect bite -Sepsis was ruled out/bacteremia ruled out -Cultures remain negative to date x24 hours -On admission, upon arrival in ED evaluation  patient met the SIRS criteria/ruling out sepsis T-max 99.1, briefly tachycardic of HR 120 currently 75, tachypneic with RR 25 blood pressure 126/82, satting 96% on room air -Per sepsis protocol status post fluid resuscitation with lactated Ringer's, -Currently hemodynamically stable -Blood and urine cultures negative x24 hours -Patient started on broad-spectrum antibiotics of vancomycin and Rocephin will be DC'd today 11/13/2019 -Patient stable, ID curbside, antibiotic switched to p.o. doxycycline and Keflex for 7 more days.  -Patient instructed on wound care dressing change and packing.   Cellulitis/abscess right buttocks -insect bite -continue current antibiotics of vancomycin and Zosyn -We will narrow down antibiotics -Continue to improve, afebrile, normotensive, improved leukocytosis -Status post I&D in ED -We will continue monitor closely -If culture remains negative in 24 hours, will switch to p.o. antibiotics ID curbside recommended clindamycin versus doxycycline/cephalexin for 7-10 days  Hypokalemia Potassium 3.3 this a.m., repleting orally  Chronic pain syndrome Resuming home medication of Percocet, as needed hydromorphone  History of asthma Currently on room air satting 96% -As needed DuoNeb treatments supplemental oxygen as needed  History of anxiety -As needed anxiolytics  -Insect bite  Cultures:  -11/13/2019 blood cultures x2>> -Urine culture ?  Wound culture Antimicrobial: -11/12/2019 >> IV vancomycin 7.23/21 -11/12/2019 IV Rocephin x1. -11/12/2019 IV Zosyn 11/13/19 -11/14/2019 p.o. doxycycline/Keflex x7 days.     Consults called:  Wound care,  ------------------------------------------------------------------------------------------------------------------------------------------ Family Communication:  none at  bedside  (The above findings and plan of care has been discussed with patient in detail, the patient expressed understanding and agreement of  above plan)  ------------------------------------------------------------------------------------------------------------------------------------------  Disposition Plan: HOME       Discharge Instructions:   Discharge Instructions    Activity as tolerated - No restrictions   Complete by: As directed    Call MD for:  redness, tenderness, or signs of infection (pain, swelling, redness, odor or green/yellow discharge around incision site)   Complete by: As directed    Call MD for:  temperature >100.4   Complete by: As directed    Diet - low sodium heart healthy   Complete by: As directed    Discharge instructions   Complete by: As directed    Continue wound care per nursing instructions. Continue current recommended antibiotics.   Discharge wound care:   Complete by: As directed    Per instruction...   Increase activity slowly   Complete by: As directed        Medication List    TAKE these medications   acetaminophen 325 MG tablet Commonly known as: TYLENOL Take 2 tablets (650 mg total) by mouth every 6 (six) hours as needed for mild pain (or Fever >/= 101).   cephALEXin 500 MG capsule Commonly known as: KEFLEX Take 1 capsule (500 mg total) by mouth 3 (three) times daily for 7 days.   doxycycline 50 MG capsule Commonly known as: VIBRAMYCIN Take 2 capsules (100 mg total) by mouth 2 (two) times daily for 7 days.   ibuprofen 400 MG tablet Commonly known as: ADVIL Take 1 tablet (400 mg total) by mouth every 6 (six) hours as needed for mild pain (or Fever >/= 101).   lactobacillus acidophilus & bulgar chewable tablet Chew 1 tablet by mouth 3 (three) times daily with meals for 10 days.   oxyCODONE-acetaminophen 10-325 MG tablet Commonly known as: PERCOCET Take 1 tablet by mouth every 4 (four) hours as needed for pain.       No Known Allergies   Procedures /Studies:   DG Chest Port 1 View  Result Date: 11/11/2019 CLINICAL DATA:  Sepsis. Insect bite to the  RIGHT buttock 1 week ago. EXAM: PORTABLE CHEST 1 VIEW COMPARISON:  None. FINDINGS: The heart size and mediastinal contours are within normal limits. Both lungs are clear. The visualized skeletal structures are unremarkable. IMPRESSION: No active disease. Electronically Signed   By: Nolon Nations M.D.   On: 11/11/2019 19:50     Subjective:   Patient was seen and examined 11/13/2019, 10:54 AM Patient stable today. No acute distress.  No issues overnight Stable for discharge.  Discharge Exam:    Vitals:   11/12/19 0937 11/12/19 1444 11/12/19 2137 11/13/19 0546  BP: (!) 146/91 (!) 135/81 (!) 135/101 (!) 134/108  Pulse: 78 87 99 71  Resp: '15 15 17 17  ' Temp: 98.7 F (37.1 C) 98 F (36.7 C) 98.1 F (36.7 C) 97.8 F (36.6 C)  TempSrc: Oral Oral Oral Oral  SpO2: 99% 97% 96% 99%  Weight:      Height:        General: Pt lying comfortably in bed & appears in no obvious distress. Cardiovascular: S1 & S2 heard, RRR, S1/S2 +. No murmurs, rubs, gallops or clicks. No JVD or pedal edema. Respiratory: Clear to auscultation without wheezing, rhonchi or crackles. No increased work of breathing. Abdominal:  Non-distended, non-tender & soft. No organomegaly or masses appreciated. Normal bowel sounds heard. CNS: Alert  and oriented. No focal deficits. Extremities: no edema, no cyanosis    The results of significant diagnostics from this hospitalization (including imaging, microbiology, ancillary and laboratory) are listed below for reference.      Microbiology:   Recent Results (from the past 240 hour(s))  Blood Culture (routine x 2)     Status: None (Preliminary result)   Collection Time: 11/11/19  6:50 PM   Specimen: BLOOD  Result Value Ref Range Status   Specimen Description   Final    BLOOD RIGHT ANTECUBITAL Performed at [facility], [address] [place], [place] [address]    Special Requests   Final    BOTTLES DRAWN AEROBIC AND ANAEROBIC Blood Culture adequate  volume Performed at [facility], [address] [place], [place] [address]    Culture   Final    NO GROWTH 2 DAYS Performed at [facility], [place] [address], [place], [place] [address]    Report Status PENDING  Incomplete  Blood Culture (routine x 2)     Status: None (Preliminary result)   Collection Time: 11/11/19  7:03 PM   Specimen: BLOOD  Result Value Ref Range Status   Specimen Description   Final    BLOOD BLOOD LEFT FOREARM Performed at [facility], [address] [place], [place] [address]    Special Requests   Final    BOTTLES DRAWN AEROBIC AND ANAEROBIC Blood Culture adequate volume Performed at [facility], [address] [place], [place] [address]    Culture   Final    NO GROWTH 2 DAYS Performed at [facility], [place] [address] [place], [place] [address]    Report Status PENDING  Incomplete  Urine culture     Status: None   Collection Time: 11/11/19  8:02 PM   Specimen: In/Out Cath Urine  Result Value Ref Range Status   Specimen Description   Final    IN/OUT CATH URINE Performed at [facility], [address] [place], [place] [address]    Special Requests   Final    NONE Performed at [facility], [address] [place], [place] [address]    Culture   Final    NO GROWTH Performed at [facility], [place] [address], [place], [place] [address]    Report Status 11/12/2019 FINAL  Final  SARS Coronavirus 2 by RT PCR (hospital order, performed in [facility] hospital lab) Nasopharyngeal Nasopharyngeal Swab     Status: None   Collection Time: 11/11/19  8:02 PM   Specimen: Nasopharyngeal Swab  Result Value Ref Range Status   SARS Coronavirus 2 NEGATIVE NEGATIVE Final    Comment: (NOTE) SARS-CoV-2 target nucleic acids are NOT DETECTED.  The SARS-CoV-2 RNA is generally detectable in upper and lower respiratory specimens during the acute phase of infection. The lowest concentration of  SARS-CoV-2 viral copies this assay can detect is 250 copies / mL. A negative result does not preclude SARS-CoV-2 infection and should not be used as the sole basis for treatment or other patient management decisions.  A negative result may occur with improper specimen collection / handling, submission of specimen other than nasopharyngeal swab, presence of viral mutation(s) within the areas targeted by this assay, and inadequate number of viral copies (<250 copies / mL). A negative result must be combined with clinical observations, patient history, and epidemiological information.  Fact  Sheet for Patients:   StrictlyIdeas.no  Fact Sheet for Healthcare Providers: BankingDealers.co.za  This test is not yet approved or  cleared by the [place] FDA and has been authorized for detection and/or diagnosis of SARS-CoV-2 by FDA under an Emergency Use Authorization (EUA).  This EUA will remain in effect (meaning this test can be used) for the duration of the COVID-19 declaration under Section 564(b)(1) of the Act, 21 U.S.C. section 360bbb-3(b)(1), unless the authorization is terminated or revoked sooner.  Performed at [facility], [address] [place], [place] [address]      Labs:   CBC: Recent Labs  Lab 11/11/19 1903 11/12/19 0533 11/13/19 0522  WBC 16.4* 11.5* 10.6*  NEUTROABS 14.1* 9.1*  --   HGB 11.7* 9.7* 10.4*  HCT 36.5* 30.9* 34.5*  MCV 81.7 82.6 85.4  PLT 346 259 852   Basic Metabolic Panel: Recent Labs  Lab 11/11/19 1903 11/12/19 0533 11/13/19 0522  NA 139 142 143  K 3.7 3.3* 4.2  CL 99 105 103  CO2 '25 27 28  ' GLUCOSE 117* 113* 101*  BUN '13 10 12  ' CREATININE 1.11 0.89 0.89  CALCIUM 9.2 9.0 9.0   Liver Function Tests: Recent Labs  Lab 11/11/19 1903 11/12/19 0533  AST 15 14*  ALT 13 12  ALKPHOS 77 61  BILITOT 0.6 0.7  PROT 8.6* 6.9  ALBUMIN 4.1 3.4*   BNP (last 3 results) No results  for input(s): BNP in the last 8760 hours. Cardiac Enzymes: No results for input(s): CKTOTAL, CKMB, CKMBINDEX, TROPONINI in the last 168 hours. CBG: Recent Labs  Lab 11/12/19 0731 11/12/19 1129  GLUCAP 101* 91   Hgb A1c Recent Labs    11/12/19 0533  HGBA1C 5.3   Lipid Profile No results for input(s): CHOL, HDL, LDLCALC, TRIG, CHOLHDL, LDLDIRECT in the last 72 hours. Thyroid function studies No results for input(s): TSH, T4TOTAL, T3FREE, THYROIDAB in the last 72 hours.  Invalid input(s): FREET3 Anemia work up No results for input(s): VITAMINB12, FOLATE, FERRITIN, TIBC, IRON, RETICCTPCT in the last 72 hours. Urinalysis    Component Value Date/Time   COLORURINE YELLOW 11/11/2019 2002   APPEARANCEUR HAZY (A) 11/11/2019 2002   LABSPEC 1.025 11/11/2019 2002   PHURINE 6.0 11/11/2019 2002   GLUCOSEU NEGATIVE 11/11/2019 2002   HGBUR NEGATIVE 11/11/2019 2002   BILIRUBINUR SMALL (A) 11/11/2019 2002   KETONESUR 40 (A) 11/11/2019 2002   PROTEINUR 100 (A) 11/11/2019 2002   NITRITE NEGATIVE 11/11/2019 2002   LEUKOCYTESUR NEGATIVE 11/11/2019 2002         Time coordinating discharge: Over 45 minutes  SIGNED: Deatra Javanni, MD, FACP, [facility]. [facility],  Please use [email or web address] to Page If 7PM-7AM, please contact night-coverage [email or web address] 11/13/2019, 10:54 AM

## 2019-11-13 NOTE — TOC Initial Note (Signed)
Transition of Care [facility]) - Initial/Assessment Note    Patient Details  Name: Sergio Conley MRN: [id number] Date of Birth: [birth year]  Transition of Care [facility]) CM/SW Contact:    Dessa Phi, RN Phone Number: 11/13/2019, 12:11 PM  Clinical Narrative:  Spoke to patient about d/c plans-d/c home w/family, has pcp,pharmacy-he will get health insurance on his pwn,has own transport. Nursing currently teaching patient on wound care. No further CM needs.                 Expected Discharge Plan: Home/Self Care Barriers to Discharge: No Barriers Identified   Patient Goals and CMS Choice Patient states their goals for this hospitalization and ongoing recovery are:: go home CMS Medicare.gov Compare Post Acute Care list provided to:: Patient Choice offered to / list presented to : NA  Expected Discharge Plan and Services Expected Discharge Plan: Home/Self Care   Discharge Planning Services: CM Consult   Living arrangements for the past 2 months: Single Family Home Expected Discharge Date: 11/13/19                                    Prior Living Arrangements/Services Living arrangements for the past 2 months: Single Family Home Lives with:: Spouse Patient language and need for interpreter reviewed:: Yes Do you feel safe going back to the place where you live?: Yes      Need for Family Participation in Patient Care: No (Comment) Care giver support system in place?: Yes (comment)   Criminal Activity/Legal Involvement Pertinent to Current Situation/Hospitalization: No - Comment as needed  Activities of Daily Living Home Assistive Devices/Equipment: None ADL Screening (condition at time of admission) Patient's cognitive ability adequate to safely complete daily activities?: Yes Is the patient deaf or have difficulty hearing?: No Does the patient have difficulty seeing, even when wearing glasses/contacts?: No Does the patient have difficulty concentrating, remembering, or  making decisions?: No Patient able to express need for assistance with ADLs?: Yes Does the patient have difficulty dressing or bathing?: No Independently performs ADLs?: Yes (appropriate for developmental age) Does the patient have difficulty walking or climbing stairs?: No Weakness of Legs: None Weakness of Arms/Hands: None  Permission Sought/Granted Permission sought to share information with : Case Manager Permission granted to share information with : Yes, Verbal Permission Granted  Share Information with NAME: Case Manager           Emotional Assessment Appearance:: Appears stated age Attitude/Demeanor/Rapport: Gracious Affect (typically observed): Accepting Orientation: : Oriented to Self, Oriented to Place, Oriented to  Time, Oriented to Situation Alcohol / Substance Use: Not Applicable Psych Involvement: No (comment)  Admission diagnosis:  Cellulitis and abscess of buttock [L02.31, L03.317] Cellulitis of buttock [L03.317] Abscess [L02.91] Sepsis without acute organ dysfunction, due to unspecified organism ([place]) [A41.9] Sepsis ([place]) [A41.9] Patient Active Problem List   Diagnosis Date Noted  . Sepsis ([place]) 11/12/2019  . IRSA (idiopathic refractory sideroblastic anemia) (HCC) 11/12/2019  . Hypokalemia 11/12/2019  . Cellulitis and abscess of buttock 11/11/2019   PCP:  Patient, No Pcp Per Pharmacy:   [id number] - [place], [place] [address] [place] [address] Phone: [phone] Fax: [phone]     Social Determinants of Health (SDOH) Interventions    Readmission Risk Interventions No flowsheet data found.

## 2019-11-13 NOTE — Progress Notes (Signed)
Patient taught how to do dressing to R Buttocks. 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 Patient verbalized an understanding.

## 2019-11-16 LAB — CULTURE, BLOOD (ROUTINE X 2)
Culture: NO GROWTH
Culture: NO GROWTH
Special Requests: ADEQUATE
Special Requests: ADEQUATE

## 2020-12-30 ENCOUNTER — Telehealth: Payer: Self-pay

## 2020-12-30 NOTE — Telephone Encounter (Signed)
Referral notes received from [facility], Phone #: [phone], Fax #: [phone]   A copy of the notes have been placed in the scheduling box for check-out to pick-up and to enter referral. Original notes placed in file cabinet.

## 2021-02-09 IMAGING — DX DG CHEST 1V PORT
1 series · 1 of 1 positions shown · non-contrast
Comparison: None.

CLINICAL DATA: Sepsis. Insect bite to the RIGHT buttock 1 week ago.

EXAM:
PORTABLE CHEST 1 VIEW

[chest ap]
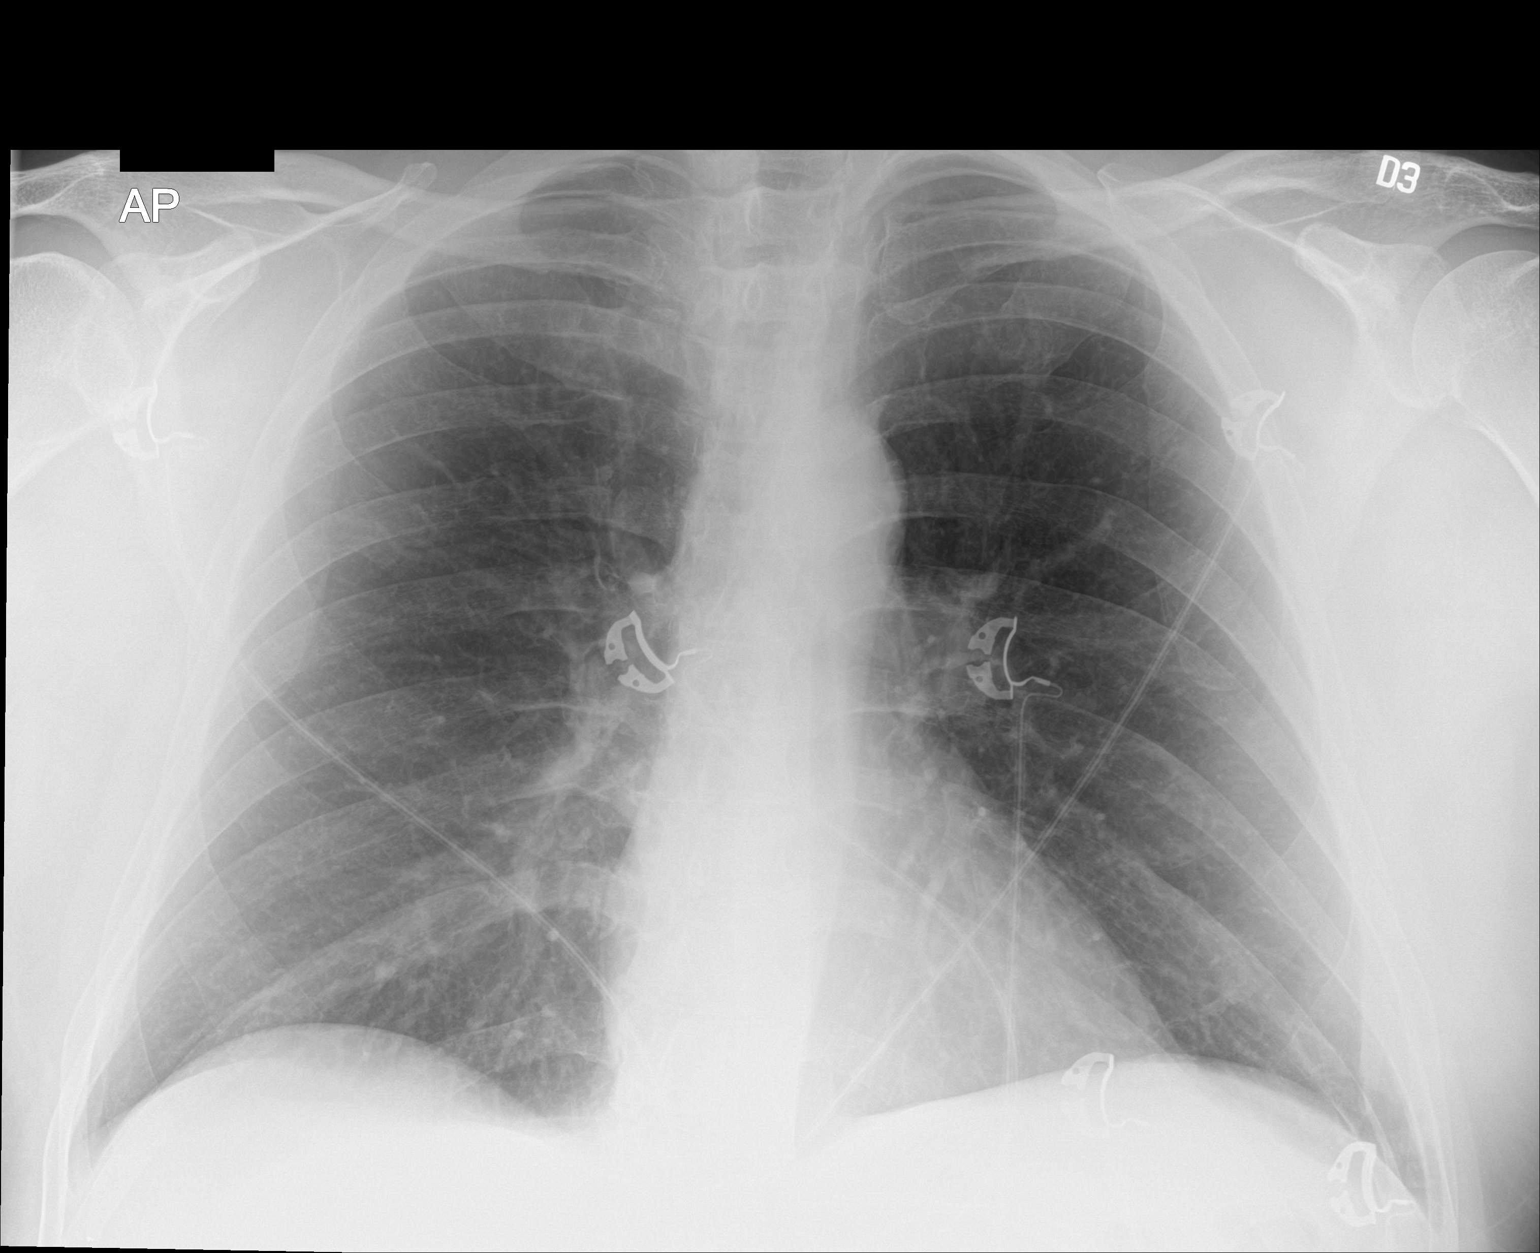

[1 of 1 positions shown; findings below may reference images not displayed]

FINDINGS: The heart size and mediastinal contours are within normal limits.
Both lungs are clear. The visualized skeletal structures are
unremarkable.
IMPRESSION: No active disease.
